# Patient Record
Sex: Female | Born: 1975
Health system: Southern US, Academic
[De-identification: ages and names within clinical notes are randomized; demographics above are authoritative.]

## PROBLEM LIST (undated history)

## (undated) DIAGNOSIS — H189 Unspecified disorder of cornea: Secondary | ICD-10-CM

## (undated) DIAGNOSIS — L732 Hidradenitis suppurativa: Secondary | ICD-10-CM

## (undated) DIAGNOSIS — L700 Acne vulgaris: Secondary | ICD-10-CM

## (undated) HISTORY — DX: Hidradenitis suppurativa: L73.2

## (undated) HISTORY — DX: Unspecified disorder of cornea: H18.9

## (undated) HISTORY — DX: Acne vulgaris: L70.0

---

## 2008-08-29 ENCOUNTER — Emergency Department (HOSPITAL_BASED_OUTPATIENT_CLINIC_OR_DEPARTMENT_OTHER): Admission: EM | Admit: 2008-08-29 | Discharge: 2008-08-29 | Payer: Self-pay | Admitting: Emergency Medicine

## 2008-08-31 ENCOUNTER — Emergency Department (HOSPITAL_BASED_OUTPATIENT_CLINIC_OR_DEPARTMENT_OTHER): Admission: EM | Admit: 2008-08-31 | Discharge: 2008-08-31 | Payer: Self-pay | Admitting: Emergency Medicine

## 2009-03-10 HISTORY — PX: CORNEAL TRANSPLANT: SHX108

## 2010-03-10 HISTORY — PX: TUBAL LIGATION: SHX77

## 2010-03-24 LAB — HM PAP SMEAR

## 2012-05-07 LAB — BASIC METABOLIC PANEL
BUN: 12 mg/dL (ref 4–21)
Creatinine: 0.6 mg/dL (ref 0.5–1.1)
Glucose: 90 mg/dL

## 2012-05-07 LAB — CBC AND DIFFERENTIAL
HCT: 40 % (ref 36–46)
Platelets: 235 10*3/uL (ref 150–399)
WBC: 5 10^3/mL

## 2012-05-07 LAB — HEPATIC FUNCTION PANEL
ALT: 12 U/L (ref 7–35)
Alkaline Phosphatase: 64 U/L (ref 25–125)
Bilirubin, Total: 0.5 mg/dL

## 2012-05-07 LAB — TSH: TSH: 1.72 u[IU]/mL (ref 0.41–5.90)

## 2012-05-07 LAB — LIPID PANEL
LDL Cholesterol: 140 mg/dL
Triglycerides: 77 mg/dL (ref 40–160)

## 2012-05-26 ENCOUNTER — Ambulatory Visit (INDEPENDENT_AMBULATORY_CARE_PROVIDER_SITE_OTHER): Payer: 59 | Admitting: Family Medicine

## 2012-05-26 ENCOUNTER — Other Ambulatory Visit (HOSPITAL_COMMUNITY)
Admission: RE | Admit: 2012-05-26 | Discharge: 2012-05-26 | Disposition: A | Discharge status: Home / Self Care | Payer: 59 | Source: Ambulatory Visit | Attending: Family Medicine | Admitting: Family Medicine

## 2012-05-26 ENCOUNTER — Encounter: Payer: Self-pay | Admitting: Family Medicine

## 2012-05-26 VITALS — BP 102/72 | HR 109 | Ht 60.0 in | Wt 154.0 lb

## 2012-05-26 DIAGNOSIS — Z124 Encounter for screening for malignant neoplasm of cervix: Secondary | ICD-10-CM

## 2012-05-26 DIAGNOSIS — Z0000 Encounter for general adult medical examination without abnormal findings: Secondary | ICD-10-CM

## 2012-05-26 DIAGNOSIS — Z01419 Encounter for gynecological examination (general) (routine) without abnormal findings: Secondary | ICD-10-CM | POA: Insufficient documentation

## 2012-05-26 DIAGNOSIS — Z1151 Encounter for screening for human papillomavirus (HPV): Secondary | ICD-10-CM | POA: Insufficient documentation

## 2012-05-26 LAB — POCT URINALYSIS DIPSTICK
Bilirubin, UA: NEGATIVE
Blood, UA: NEGATIVE
Glucose, UA: NEGATIVE
Ketones, UA: NEGATIVE
Leukocytes, UA: NEGATIVE
Nitrite, UA: NEGATIVE
Protein, UA: NEGATIVE
Spec Grav, UA: 1.01
Urobilinogen, UA: NEGATIVE
pH, UA: 8

## 2012-05-26 MED ORDER — METRONIDAZOLE 0.75 % VA GEL: 1.0000 | Freq: Two times a day (BID) | VAGINAL | Status: AC

## 2012-05-26 NOTE — Progress Notes (Deleted)
Subjective:     Patient ID: Amanda Kennedy, female   DOB: 09/10/1975, 37 y.o.   MRN: [id number]  HPI Amanda Kennedy is here today for her annual exam.  She has done well since her last office visit. She would like to discuss weight loss and the Accutane treatment for acne.     Review of Systems     Objective:   Physical Exam     Assessment:     ***    Plan:     ***

## 2012-05-26 NOTE — Progress Notes (Signed)
Patient ID: Amanda Kennedy, female   DOB: 09/10/1975, 37 y.o.   MRN: [id number] Chief Complaint  Patient presents with  . Annual Exam    Pap   . Weight Loss  . Acne     HPI:  Amanda Kennedy is here for her annual exam with pap.  She has done well since her last visit.  We discussed her doing Accutane at her last visit.  She read over the booklet and completed her information.  Her only question/concern is the part she read about depression.  She is also interested in weight loss. She has a wedding in May that she is wanting to lose weight for.  She checked into Qsymia and says that her insurance does cover it.  She is also interested in learning more about the HCG diet.    Past Medical History  Diagnosis Date  . Cystic acne   . Corneal abnormality     right  . Hidradenitis    Past Surgical History  Procedure Laterality Date  . Tubal ligation  2012  . Corneal transplant Right 2011    [facility] - Dr Colon Branch   Family History  Problem Relation Age of Onset  . Vascular Disease Father     Enlarged Heart  . Diabetes type II Father     Social History History  Substance Use Topics  . Smoking status: Never Smoker   . Smokeless tobacco: Never Used  . Alcohol Use: No    Current Outpatient Prescriptions  Medication Sig Dispense Refill  . Adapalene-Benzoyl Peroxide (EPIDUO) 0.1-2.5 % gel Apply 1 application topically at bedtime. Apply a small amount to clean, dry skin at night.      Marland Kitchen aluminum chloride (DRYSOL) 20 % external solution Apply 1 application topically 3 (three) times a week.      . doxycycline (ADOXA) 50 MG tablet Take 50-100 mg by mouth daily.      . prednisoLONE acetate (PRED FORTE) 1 % ophthalmic suspension Place 2 drops into both eyes once.      . metroNIDAZOLE (METROGEL VAGINAL) 0.75 % vaginal gel Place 1 Applicatorful vaginally 2 (two) times daily.  70 g  0   No current facility-administered medications for this visit.   Allergies  Allergen Reactions  . Ibuprofen      Review of Systems  Constitutional: Negative.  Negative for fever, chills, diaphoresis, activity change, appetite change, fatigue and unexpected weight change.       She has had trouble losing weight even though she is only eating 1000 calories per day and is working out with a Psychologist, educational.    HENT: Negative for hearing loss, ear pain, nosebleeds, congestion, sore throat, facial swelling, rhinorrhea, sneezing, drooling, mouth sores, trouble swallowing, neck pain, neck stiffness, dental problem, voice change, postnasal drip, sinus pressure, tinnitus and ear discharge.   Eyes: Negative for photophobia, pain, discharge, redness, itching and visual disturbance.  Respiratory: Negative for apnea, cough, choking, chest tightness, shortness of breath, wheezing and stridor.   Cardiovascular: Negative for chest pain, palpitations and leg swelling.  Gastrointestinal: Negative for nausea, vomiting, abdominal pain, diarrhea, constipation, blood in stool, abdominal distention, anal bleeding and rectal pain.  Endocrine: Negative for cold intolerance, heat intolerance, polydipsia, polyphagia and polyuria.  Genitourinary: Negative for dysuria, urgency, frequency, hematuria, flank pain, decreased urine volume, vaginal bleeding, vaginal discharge, enuresis, difficulty urinating, genital sores, vaginal pain, menstrual problem, pelvic pain and dyspareunia.  Musculoskeletal: Negative for myalgias, back pain, joint swelling, arthralgias and gait problem.  Skin: Negative for color change, pallor and rash. Wound: Acne on face and cysts in axilla   Allergic/Immunologic: Negative for environmental allergies, food allergies and immunocompromised state.  Neurological: Negative for dizziness, tremors, seizures, syncope, facial asymmetry, speech difficulty, weakness, light-headedness, numbness and headaches.  Hematological: Negative for adenopathy. Does not bruise/bleed easily.  Psychiatric/Behavioral: Negative for suicidal ideas,  hallucinations, behavioral problems, confusion, sleep disturbance, self-injury, dysphoric mood, decreased concentration and agitation. The patient is not nervous/anxious and is not hyperactive.   All other systems reviewed and are negative.   BP 102/72  Pulse 109  Ht 5' (1.524 m)  Wt 154 lb (69.854 kg)  BMI 30.08 kg/m2  LMP 05/07/2012 Physical Exam  Constitutional: She is oriented to person, place, and time. She appears well-developed and well-nourished.  HENT:  Head: Normocephalic and atraumatic.  Right Ear: External ear normal.  Left Ear: External ear normal.  Nose: Nose normal.  Mouth/Throat: Oropharynx is clear and moist.  Eyes: Conjunctivae and EOM are normal. Pupils are equal, round, and reactive to light.  Neck: Normal range of motion. Thyromegaly (The left side of her thyroid is enlarged.  ) present.  Cardiovascular: Normal rate, regular rhythm, normal heart sounds and intact distal pulses.  Exam reveals no gallop and no friction rub.   No murmur heard. Pulmonary/Chest: Effort normal and breath sounds normal.  Abdominal: Soft. Bowel sounds are normal.  Genitourinary: Uterus normal. Vaginal discharge (She has a thin, white discharge ) found.  Musculoskeletal: Normal range of motion. She exhibits no edema and no tenderness.  Lymphadenopathy:    She has no cervical adenopathy.  Neurological: She is alert and oriented to person, place, and time. She has normal reflexes.  Skin: Skin is warm and dry.  Acne is present on her face.   Psychiatric: She has a normal mood and affect. Her behavior is normal. Judgment and thought content normal.    Assessment:   1)  Normal Exam - U/A was WNL.  We'll check her for HPV 16/18.    2)  Acne:  She may do Accutane in the future.  She is going to continue on Epiduo for now.    3)  Weight:  She is interested in losing 15-20 lbs so the HCG diet is probably the best choice for her   4)  Bacterial Vaginosis   Plan:  1)  We'll mail her a  copy of "Pounds & Inches".  She will read this and decide if she wants to do the HCG diet.   2)  She is to use Metrogel BID for 5 days.

## 2012-05-26 NOTE — Patient Instructions (Addendum)
Plan:   1)  Read Pounds and Inches to decide if you want to do the HCG diet.  2)  Use the Metrogel twice a day for 5 days.  3)  Continue with the Epiduo for the next month.  If you decide to do Accutane then you'll need to do it after the HCG diet.    Goiter Goiter is an enlarged thyroid gland. The thyroid gland sits at the base of the front of the neck. The gland produces hormones that regulate mood, body temperature, pulse rate, and digestion. Most goiters are painless and are not a cause for serious concern. Goiters and conditions that cause goiters can be treated if necessary.  CAUSES  Common causes of goiter include:  Graves disease (causes too much hormone to be produced [hyperthyroidism]).  Hashimoto's disease (causes too little hormone to be produced [hypothyroidism]).  Thyroiditis (inflammation of the thyroid sometimes caused by virus or pregnancy).  Nodular goiter (small bumps form; sometimes called toxic nodular goiter).  Pregnancy.  Thyroid cancer (very few goiters with nodules are cancerous).  Certain medications.  Radiation exposure.  Iodine deficiency (more common in developing countries in inland populations). RISK FACTORS Risk factors for goiter include:  A family history of goiter.  Female gender.  Inadequate iodine in the diet.  Age older than 40 years. SYMPTOMS  Many goiters do not cause symptoms. When symptoms do occur, they may include:  Swelling in the lower part of the neck. This swelling can range from a very small bump to a large lump.  A tight feeling in the throat.  A hoarse voice. Less commonly, a goiter may result in:  Coughing.  Wheezing.  Difficulty swallowing.  Difficulty breathing.  Bulging neck veins.  Dizziness. When a goiter is the result of hyperthyroidism, symptoms may include:  Rapid or irregular heart beat.  Sicknessin your stomach (nausea).  Vomiting.  Diarrhea.  Shaking.  Irritable feeling.  Bulging  eyes.  Weight loss.  Heat sensitivity.  Anxiety. When a goiter is the result of hypothyroidism, symptoms may include:  Tiredness.  Dry skin.  Constipation.  Weight gain.  Irregular menstrual cycle.  Depressed mood.  Sensitivity to cold. DIAGNOSIS  Tests used to diagnose goiter include:  A physical exam.  Blood tests, including thyroid hormone levels and antibody testing.  Ultrasonography, computerized X-ray scan (computed tomography, CT) or computerized magnetic scan (magnetic resonance imaging, MRI).  Thyroid scan (imaging along with safe radioactive injection).  Tissue sample taken (biopsy) of nodules. This is sometimes done to confirm that the nodules are not cancerous. TREATMENT  Treatment will depend on the cause of the goiter. Treatment may include:  Monitoring. In some cases, no treatment is necessary, and your doctor will monitor yourcondition at regular check ups.  Medications and supplements. Thyroid medication (thyroid hormone replacement) is available for hyperthroidism and hypothyroidism.  If inflammation is the cause, over-the-counter medication or steroid medication may be recommended.  Goiters caused by iodine deficiency can be treated with iodine supplements or changes in diet.  Radioactive iodine treatment. Radioactive iodine is injected into the blood. It travels to the thyroid gland, kills thyroid cells, and reduces the size of the gland. This is only used when the thyroid gland is overactive. Lifelong thyroid hormone medication is often necessary after this treatment.  Surgery. A procedure to remove all or part of the gland may be recommended in severe cases or when cancer is the cause. Hormones can be taken to replace the hormones normally produced by  the thyroid. HOME CARE INSTRUCTIONS   Take medications as directed.  Follow your caregiver's recommendations for any dietary changes.  Follow up with your caregiver for further examination and  testing, as directed. PREVENTION   If you have a family history of goiter, discuss screening with your doctor.  Make sure you are getting enough iodine in your diet.  Use of iodized table salt can help prevent iodine deficiency. Document Released: 08/14/2009 Document Revised: 05/19/2011 Document Reviewed: 08/14/2009 [facility] Patient Information 2013 [place], [place].

## 2012-06-04 ENCOUNTER — Ambulatory Visit (INDEPENDENT_AMBULATORY_CARE_PROVIDER_SITE_OTHER): Payer: 59 | Admitting: Family Medicine

## 2012-06-04 ENCOUNTER — Encounter: Payer: Self-pay | Admitting: Family Medicine

## 2012-06-04 VITALS — BP 115/82 | HR 96 | Wt 153.0 lb

## 2012-06-04 DIAGNOSIS — R000 Tachycardia, unspecified: Secondary | ICD-10-CM

## 2012-06-04 DIAGNOSIS — E669 Obesity, unspecified: Secondary | ICD-10-CM

## 2012-06-04 MED ORDER — CHORIONIC GONADOTROPIN 10000 UNITS IM SOLR: INTRAMUSCULAR | Status: DC

## 2012-06-04 NOTE — Progress Notes (Signed)
Subjective:     Patient ID: Amanda Kennedy, female   DOB: 1976/02/07, 37 y.o.   MRN: [id number]  HPI [name] is here today to begin the Step By Step Diet & Fitness Program.  She has tried numerous diet programs and has not been successful with them.  She feels that she needs to lose weight to improve her general health.  Review of Systems  Constitutional: Positive for unexpected weight change.       Objective:   Physical Exam  Constitutional: She appears well-nourished.  Neck: Neck supple. No thyromegaly present.  Cardiovascular: Normal rate, regular rhythm and normal heart sounds.   Pulmonary/Chest: Effort normal and breath sounds normal.  Abdominal: She exhibits no distension and no mass. There is no tenderness.  Psychiatric: She has a normal mood and affect. Her behavior is normal. Judgment and thought content normal.       Assessment:     Obesity    Plan:     HCG Diet

## 2012-06-06 ENCOUNTER — Encounter: Payer: Self-pay | Admitting: Family Medicine

## 2012-06-06 DIAGNOSIS — E669 Obesity, unspecified: Secondary | ICD-10-CM | POA: Insufficient documentation

## 2012-06-06 DIAGNOSIS — R000 Tachycardia, unspecified: Secondary | ICD-10-CM | POA: Insufficient documentation

## 2012-06-06 MED ORDER — CYANOCOBALAMIN 1000 MCG/ML IJ SOLN: INTRAMUSCULAR | Status: DC

## 2012-06-06 MED ORDER — CHORIONIC GONADOTROPIN 10000 UNITS IM SOLR: INTRAMUSCULAR | Status: DC

## 2012-06-06 NOTE — Patient Instructions (Signed)
HCG Diet - 500 Calories per day;  .15 cc IM daily    HCG Diet Human Chorionic Gonadotropin, HCG injection What is this medicine? HUMAN CHORIONIC GONADOTROPIN (HYOO muhn kor ee ON ik goe NAD oh troe pin) is a hormone. HCG is used for different reasons in men and women. HCG is used in combination with other fertility drugs to increase a woman's chance of pregnancy. In men or adolescent boys, HCG helps the production of testosterone and sperm. HCG is also used in female children with cryptorchidism, a specific birth problem of the testes. This medicine may be used for other purposes; ask your health care provider or pharmacist if you have questions. What should I tell my health care provider before I take this medicine? They need to know if you have any of these conditions: -asthma -cyst on the ovary -heart disease -migraine -kidney disease -ovarian cancer or other female-related cancer -prostate cancer or other female-related cancer -seizures (convulsions) -an unusual or allergic reaction to HCG, other hormones, other medicines, foods, dyes, or preservatives -pregnant (this medicine should not be used if you are already pregnant) -breast feeding How should I use this medicine? This medicine is either injected in a muscle, like the thigh or buttocks, or it may be given under the skin instead. Ask your doctor which way is right for you. You will be taught how to prepare and give this medicine. Use exactly as directed. Take your medicine at regular intervals. Do not take your medicine more often than directed. It is important that you put your used needles and syringes in a special sharps container. Do not put them in a trash can. If you do not have a sharps container, call your pharmacist or healthcare provider to get one. Talk to your pediatrician regarding the use of this medicine in children. While this drug may be prescribed for female children as young as several months of age for selected  conditions, precautions do apply. Overdosage: If you think you have taken too much of this medicine contact a poison control center or emergency room at once. NOTE: This medicine is only for you. Do not share this medicine with others. What if I miss a dose? It is important not to miss your dose. Call your doctor or health care professional if you are unable to keep an appointment. For men or boys: If you are giving your own injections, and miss a dose, take it as soon as you remember. If you forget until the next day, skip the missed dose and continue with your schedule. Do not use double or extra doses. Call your doctor if you have any questions. For women receiving fertility treatment: It is important not to miss a dose, as the success of your fertility treatment depends on proper use of this medication. Call your doctor or health care professional if you are unable to keep an appointment. If you are giving your own injections, do not use double or extra doses. Call your doctor if you have any questions. What may interact with this medicine? Check with your doctor or healthcare professional if you are taking any of the following medications: -herbal or dietary supplements, like blue cohosh, black cohosh, or chasteberry This list may not describe all possible interactions. Give your health care provider a list of all the medicines, herbs, non-prescription drugs, or dietary supplements you use. Also tell them if you smoke, drink alcohol, or use illegal drugs. Some items may interact with your medicine. What should  I watch for while using this medicine? For men or boys: Your doctor must closely monitor you. Call your doctor if you notice any unusual effects. For women receiving fertility treatments: Your doctor must closely monitor you. Urine samples, blood tess, or ultrasound exams may be used to monitor treatment. If you think you have become pregnant, contact your doctor at once. Talk with your  doctor about limiting alcohol and decreasing tobacco use during your fertility treatments. What side effects may I notice from receiving this medicine? Side effects that you should report to your doctor or health care professional as soon as possible: -allergic reactions like skin rash, itching or hives, swelling of the face, lips, or tongue For boys: -acne (pimples) -breast enlargement -enlargement of penis and testes -development of facial or pubic hair -a sudden increase in height For women on fertility treatments: -indigestion -nausea, vomiting -passing small amounts of urine -shortness of breath -stomach area or pelvic pain or bloating -swelling -rapid weight gain Side effects that usually do not require medical attention (report to your doctor or health care professional if they continue or are bothersome): -changes in emotions or mood -headache -pain, irritation or inflammation at the injection site -tiredness This list may not describe all possible side effects. Call your doctor for medical advice about side effects. You may report side effects to FDA at 1-800-FDA-1088. Where should I keep my medicine? Keep out of the reach of children. You may not need to store this medicine at home. If you are taking this medicine at home, ask your pharmacist how to store the product you are using. Throw away any unused medication after the expiration date. NOTE: This sheet is a summary. It may not cover all possible information. If you have questions about this medicine, talk to your doctor, pharmacist, or health care provider.  2012, [employer]. (07/09/2007 4:13:33 PM)

## 2012-06-11 ENCOUNTER — Ambulatory Visit: Payer: 59 | Admitting: *Deleted

## 2012-06-11 VITALS — Wt 151.0 lb

## 2012-06-11 DIAGNOSIS — E669 Obesity, unspecified: Secondary | ICD-10-CM

## 2012-06-17 ENCOUNTER — Ambulatory Visit (INDEPENDENT_AMBULATORY_CARE_PROVIDER_SITE_OTHER): Payer: 59 | Admitting: Family Medicine

## 2012-06-17 VITALS — BP 118/85 | HR 115 | Ht 60.0 in | Wt 152.0 lb

## 2012-06-17 DIAGNOSIS — E669 Obesity, unspecified: Secondary | ICD-10-CM

## 2012-06-17 NOTE — Progress Notes (Signed)
Subjective:     Patient ID: Amanda Kennedy, female   DOB: May 05, 1975, 37 y.o.   MRN: [id number]  HPI Amanda Kennedy is here today for a follow up of her weight loss. She has just completed her 2nd week of the "Step by Step" Program. She is injecting .15 cc of HCG daily and has been trying to follow the 500 calorie diet but has not followed it as closely as she could/should.  She has gained one pound since her last visit. She feels that she has lost inches. She is taking the Phendimetrazine without difficulty.       Review of Systems  Constitutional: Positive for appetite change. Negative for fatigue.  Cardiovascular: Negative for chest pain, palpitations and leg swelling.  Gastrointestinal: Negative for constipation.  Neurological: Negative for weakness and light-headedness.  Psychiatric/Behavioral: Negative for sleep disturbance.       Objective:   Physical Exam  Constitutional: She is oriented to person, place, and time. She appears well-nourished. No distress.  HENT:  Head: Normocephalic.  Eyes: Conjunctivae are normal. No scleral icterus.  Neck: Normal range of motion. No thyromegaly present.  Cardiovascular: Normal rate, regular rhythm and normal heart sounds.   Pulmonary/Chest: Effort normal and breath sounds normal.  Musculoskeletal: Normal range of motion.  Neurological: She is alert and oriented to person, place, and time.  Skin: Skin is warm and dry. No rash noted.  Psychiatric: She has a normal mood and affect. Her behavior is normal. Judgment and thought content normal.       Assessment:     Obesity    Plan:     We discussed the fact that she has not been following the diet according to Dr. Melene Muller plan.  She was given the sheet again and was encouraged to follow it exactly if she wants to lose weight.    TIME 15 MINUTES:  MORE THAN 50 % OF TIME WAS INVOLVED IN COUNSELING.

## 2012-06-22 ENCOUNTER — Encounter: Payer: Self-pay | Admitting: Family Medicine

## 2012-06-23 ENCOUNTER — Encounter: Payer: Self-pay | Admitting: *Deleted

## 2012-06-24 ENCOUNTER — Ambulatory Visit: Payer: 59 | Admitting: *Deleted

## 2012-06-24 ENCOUNTER — Encounter: Payer: Self-pay | Admitting: *Deleted

## 2012-06-28 ENCOUNTER — Encounter: Payer: Self-pay | Admitting: *Deleted

## 2012-06-28 NOTE — Progress Notes (Signed)
Unable to close encounter

## 2012-07-02 ENCOUNTER — Encounter: Payer: Self-pay | Admitting: Family Medicine

## 2012-07-02 ENCOUNTER — Ambulatory Visit (INDEPENDENT_AMBULATORY_CARE_PROVIDER_SITE_OTHER): Payer: 59 | Admitting: Family Medicine

## 2012-07-02 VITALS — BP 115/79 | HR 108 | Wt 150.0 lb

## 2012-07-02 DIAGNOSIS — L708 Other acne: Secondary | ICD-10-CM

## 2012-07-02 DIAGNOSIS — B372 Candidiasis of skin and nail: Secondary | ICD-10-CM

## 2012-07-02 DIAGNOSIS — E669 Obesity, unspecified: Secondary | ICD-10-CM

## 2012-07-02 DIAGNOSIS — L709 Acne, unspecified: Secondary | ICD-10-CM

## 2012-07-02 DIAGNOSIS — J309 Allergic rhinitis, unspecified: Secondary | ICD-10-CM

## 2012-07-02 MED ORDER — MOMETASONE FUROATE 50 MCG/ACT NA SUSP: 2.0000 | Freq: Every day | NASAL | Status: AC

## 2012-07-02 MED ORDER — NYSTATIN 100000 UNIT/GM EX CREA: TOPICAL_CREAM | Freq: Two times a day (BID) | CUTANEOUS | Status: AC

## 2012-07-02 MED ORDER — PHENTERMINE HCL 37.5 MG PO TABS
37.5000 mg | ORAL_TABLET | Freq: Every day | ORAL | Status: AC
Start: 2012-07-02 — End: 2013-07-02

## 2012-07-02 NOTE — Progress Notes (Signed)
Subjective:     Patient ID: Amanda Kennedy, female   DOB: Feb 14, 1976, 37 y.o.   MRN: [id number]  HPI  Amanda Kennedy is here today for a follow up of her weight loss and to discuss her allergies and a skin rash on her chest between her breasts.  She has taken Claritin and Zyrtec which are not giving her any relief from her allergy symptoms.  She has just completed her 4th week of the "Step By Step"  Program.  She has been Phendimetrazine doing HCG injections and has tried to follow the 500 calorie diet.  She feels that she has lost more inches than lbs (3) over the past month.  Another concern she has is a rash located between her breasts.  She is still interested in taking Accutane for her acne and hopes that it will also help the bumps under her arms.    Review of Systems  Constitutional: Negative for fatigue and unexpected weight change.  HENT: Positive for congestion, rhinorrhea and postnasal drip.   Eyes: Negative for itching.  Respiratory: Negative for cough and shortness of breath.   Cardiovascular: Negative for chest pain, palpitations and leg swelling.  Skin: Positive for rash (Yeast vs Fungal Infection between her breasts).       Moderate acne on her forehead.    Allergic/Immunologic: Positive for environmental allergies.  Neurological: Negative.   Psychiatric/Behavioral: Negative.        Objective:   Physical Exam  Constitutional: She appears well-nourished. No distress.  HENT:  Head: Normocephalic.  Mouth/Throat: No oropharyngeal exudate.  Eyes: Conjunctivae are normal. Right eye exhibits no discharge. Left eye exhibits no discharge.  Neck: Neck supple.  Cardiovascular: Normal rate, regular rhythm and normal heart sounds.  Exam reveals no gallop and no friction rub.   No murmur heard. Pulmonary/Chest: Effort normal and breath sounds normal. She has no wheezes. She exhibits no tenderness.  Lymphadenopathy:    She has no cervical adenopathy.  Neurological: She is alert.  Skin: Skin  is warm and dry. Rash noted.  Acne on forehead.   Psychiatric: She has a normal mood and affect.       Assessment:     Allergic Rhinitis Acne Obesity     Plan:     1)  Neil Med Sinus Rinse (Distilled Water/Blue Packet) Rinse out nasal passage 1-2 times per day.  Try Allegra or Allegra D; Chlorpheniramine 4 mg 2-3 times per day plus pseudoephedrine 120 mg 2 times per day; Nasonex 2 sprays at night.    2)  She will return to start on Accutane in one month.    3)  She is going to move on to Phase III of the Step By Step program.

## 2012-07-02 NOTE — Patient Instructions (Addendum)
1)  Allergic Rhinitis - [facility] Med Sinus Rinse (Distilled Water/Blue Packet) Rinse out nasal passage 1-2 times per day.  Try Allegra or Allegra D; Chlorpheniramine 4 mg 2-3 times per day plus pseudoephedrine 120 mg 2 times per day; Nasonex 2 sprays at night.    Allergic Rhinitis Allergic rhinitis is when the mucous membranes in the nose respond to allergens. Allergens are particles in the air that cause your body to have an allergic reaction. This causes you to release allergic antibodies. Through a chain of events, these eventually cause you to release histamine into the blood stream (hence the use of antihistamines). Although meant to be protective to the body, it is this release that causes your discomfort, such as frequent sneezing, congestion and an itchy runny nose.  CAUSES  The pollen allergens may come from grasses, trees, and weeds. This is seasonal allergic rhinitis, or "hay fever." Other allergens cause year-round allergic rhinitis (perennial allergic rhinitis) such as house dust mite allergen, pet dander and mold spores.  SYMPTOMS   Nasal stuffiness (congestion).  Runny, itchy nose with sneezing and tearing of the eyes.  There is often an itching of the mouth, eyes and ears. It cannot be cured, but it can be controlled with medications. DIAGNOSIS  If you are unable to determine the offending allergen, skin or blood testing may find it. TREATMENT   Avoid the allergen.  Medications and allergy shots (immunotherapy) can help.  Hay fever may often be treated with antihistamines in pill or nasal spray forms. Antihistamines block the effects of histamine. There are over-the-counter medicines that may help with nasal congestion and swelling around the eyes. Check with your caregiver before taking or giving this medicine. If the treatment above does not work, there are many new medications your caregiver can prescribe. Stronger medications may be used if initial measures are ineffective.  Desensitizing injections can be used if medications and avoidance fails. Desensitization is when a patient is given ongoing shots until the body becomes less sensitive to the allergen. Make sure you follow up with your caregiver if problems continue. SEEK MEDICAL CARE IF:   You develop fever (more than 100.5 F (38.1 C).  You develop a cough that does not stop easily (persistent).  You have shortness of breath.  You start wheezing.  Symptoms interfere with normal daily activities. Document Released: 11/19/2000 Document Revised: 05/19/2011 Document Reviewed: 05/31/2008 [facility] Patient Information 2013 [place], [place].

## 2012-07-04 DIAGNOSIS — L709 Acne, unspecified: Secondary | ICD-10-CM | POA: Insufficient documentation

## 2012-07-04 DIAGNOSIS — J309 Allergic rhinitis, unspecified: Secondary | ICD-10-CM | POA: Insufficient documentation

## 2012-07-04 DIAGNOSIS — B372 Candidiasis of skin and nail: Secondary | ICD-10-CM | POA: Insufficient documentation

## 2012-07-07 NOTE — Progress Notes (Unsigned)
Amanda Kennedy came for her weight check.  She was reminded to come for her next appt. PG

## 2012-07-07 NOTE — Progress Notes (Signed)
Luis came to check her weight.  She was reminded to come next week for her next weight check. PG

## 2012-07-09 ENCOUNTER — Encounter: Payer: Self-pay | Admitting: Family Medicine

## 2012-07-09 ENCOUNTER — Ambulatory Visit (INDEPENDENT_AMBULATORY_CARE_PROVIDER_SITE_OTHER): Payer: 59 | Admitting: Family Medicine

## 2012-07-09 ENCOUNTER — Ambulatory Visit (HOSPITAL_BASED_OUTPATIENT_CLINIC_OR_DEPARTMENT_OTHER)
Admission: RE | Admit: 2012-07-09 | Discharge: 2012-07-09 | Disposition: A | Discharge status: Home / Self Care | Payer: 59 | Source: Ambulatory Visit | Attending: Family Medicine | Admitting: Family Medicine

## 2012-07-09 VITALS — BP 107/74 | HR 125 | Temp 100.0°F | Wt 149.0 lb

## 2012-07-09 DIAGNOSIS — R509 Fever, unspecified: Secondary | ICD-10-CM

## 2012-07-09 DIAGNOSIS — R059 Cough, unspecified: Secondary | ICD-10-CM

## 2012-07-09 DIAGNOSIS — R05 Cough: Secondary | ICD-10-CM

## 2012-07-09 LAB — POCT INFLUENZA A/B
Influenza A, POC: NEGATIVE
Influenza B, POC: NEGATIVE

## 2012-07-09 MED ORDER — HYDROCOD POLST-CHLORPHEN POLST 10-8 MG/5ML PO LQCR: 5.0000 mL | Freq: Two times a day (BID) | ORAL | Status: AC | PRN

## 2012-07-09 MED ORDER — AZITHROMYCIN 250 MG PO TABS: ORAL_TABLET | ORAL | Status: AC

## 2012-07-09 MED ORDER — BENZONATATE 200 MG PO CAPS: 200.0000 mg | ORAL_CAPSULE | Freq: Three times a day (TID) | ORAL | Status: AC | PRN

## 2012-07-09 NOTE — Progress Notes (Signed)
  Subjective:    Patient ID: Amanda Kennedy, female    DOB: Dec 17, 1975, 37 y.o.   MRN: [id number]  HPI:   Amanda Kennedy is here today with URI symptoms.   URI  This is a new problem. The current episode started in the past 7 days. The problem has been gradually worsening. The maximum temperature recorded prior to her arrival was 100 - 100.9 F. The fever has been present for 3 to 4 days. Associated symptoms include congestion, coughing, headaches and joint pain. Pertinent negatives include no chest pain or ear pain.      Review of Systems  Constitutional: Positive for fever and chills.  HENT: Positive for congestion. Negative for ear pain and neck stiffness.   Eyes: Negative for visual disturbance.  Respiratory: Positive for cough, chest tightness and shortness of breath.   Cardiovascular: Negative for chest pain and palpitations.  Gastrointestinal: Negative.   Genitourinary: Negative.   Musculoskeletal: Positive for joint pain.  Neurological: Positive for headaches.  Psychiatric/Behavioral: Negative.    Past Medical History  Diagnosis Date  . Cystic acne   . Corneal abnormality     right  . Hidradenitis    Family History  Problem Relation Age of Onset  . Vascular Disease Father     Enlarged Heart  . Diabetes type II Father        Objective:   Physical Exam  Constitutional: She appears well-nourished. She appears distressed (She is lying down on exam table.  ).  HENT:  Head: Normocephalic and atraumatic.  Mouth/Throat: No oropharyngeal exudate.  Eyes: Conjunctivae are normal. Right eye exhibits no discharge. Left eye exhibits no discharge. No scleral icterus.  Neck: Neck supple.  Cardiovascular: Normal rate, regular rhythm and normal heart sounds.  Exam reveals no gallop and no friction rub.   No murmur heard. Pulmonary/Chest: Effort normal and breath sounds normal. She has no wheezes. She exhibits no tenderness.  Abdominal: Soft. There is no tenderness.  Lymphadenopathy:   She has no cervical adenopathy.  Neurological: She is alert.  Skin: Skin is warm and dry. No rash noted.  Psychiatric: She has a normal mood and affect.       Assessment & Plan:   1)  Cough - CXR and Flu Test were WNL.    Mucinex DM - 1200/60 twice a day (lot of water)  Tessalon Perles 3 x per day Tussionex 1 tsp 2 x per day Dulera - 2 puffs twice a day Albuterol - 2 puffs 4 times per day Umcka Cold Care - 2 droppers 3-4 times per day  2)  Fever - Tylenol and Ibuprofen to control fever.

## 2012-07-09 NOTE — Patient Instructions (Addendum)
1)  Flu Test was negative and you don't have pneumonia.  2)  Chest Congestion/Wheezing:  Mucinex DM - 1200/60 twice a day (lot of water)  Tessalon Perles 3 x per day Tussionex 1 tsp 2 x per day Tylenol 1000 mg 3 x per day Dulera - 2 puffs twice a day Albuterol - 2 puffs 4 times per day  Umcka Cold Care - 2 droppers 3-4 times per day   If you are 10 days into this and not any better or worsen then try the Zithromax.  Acute Bronchitis You have acute bronchitis. This means you have a chest cold. The airways in your lungs are red and sore (inflamed). Acute means it is sudden onset.  CAUSES Bronchitis is most often caused by the same virus that causes a cold. SYMPTOMS   Body aches.  Chest congestion.  Chills.  Cough.  Fever.  Shortness of breath.  Sore throat. TREATMENT  Acute bronchitis is usually treated with rest, fluids, and medicines for relief of fever or cough. Most symptoms should go away after a few days or a week. Increased fluids may help thin your secretions and will prevent dehydration. Your caregiver may give you an inhaler to improve your symptoms. The inhaler reduces shortness of breath and helps control cough. You can take over-the-counter pain relievers or cough medicine to decrease coughing, pain, or fever. A cool-air vaporizer may help thin bronchial secretions and make it easier to clear your chest. Antibiotics are usually not needed but can be prescribed if you smoke, are seriously ill, have chronic lung problems, are elderly, or you are at higher risk for developing complications.Allergies and asthma can make bronchitis worse. Repeated episodes of bronchitis may cause longstanding lung problems. Avoid smoking and secondhand smoke.Exposure to cigarette smoke or irritating chemicals will make bronchitis worse. If you are a cigarette smoker, consider using nicotine gum or skin patches to help control withdrawal symptoms. Quitting smoking will help your lungs heal  faster. Recovery from bronchitis is often slow, but you should start feeling better after 2 to 3 days. Cough from bronchitis frequently lasts for 3 to 4 weeks. To prevent another bout of acute bronchitis:  Quit smoking.  Wash your hands frequently to get rid of viruses or use a hand sanitizer.  Avoid other people with cold or virus symptoms.  Try not to touch your hands to your mouth, nose, or eyes. SEEK IMMEDIATE MEDICAL CARE IF:  You develop increased fever, chills, or chest pain.  You have severe shortness of breath or bloody sputum.  You develop dehydration, fainting, repeated vomiting, or a severe headache.  You have no improvement after 1 week of treatment or you get worse. MAKE SURE YOU:   Understand these instructions.  Will watch your condition.  Will get help right away if you are not doing well or get worse. Document Released: 04/03/2004 Document Revised: 05/19/2011 Document Reviewed: 06/19/2010 [facility] Patient Information 2013 [place], [place].

## 2012-07-18 ENCOUNTER — Encounter: Payer: Self-pay | Admitting: Family Medicine

## 2012-08-03 ENCOUNTER — Encounter: Payer: Self-pay | Admitting: *Deleted

## 2012-08-03 ENCOUNTER — Ambulatory Visit: Payer: 59 | Admitting: Family Medicine

## 2012-08-17 ENCOUNTER — Ambulatory Visit: Payer: 59 | Admitting: Family Medicine

## 2012-08-17 DIAGNOSIS — Z0289 Encounter for other administrative examinations: Secondary | ICD-10-CM

## 2013-12-31 IMAGING — CR DG CHEST 2V
2 series · 2 of 2 positions shown · non-contrast
Comparison: None.

CLINICAL DATA: Cough, fever

CHEST - 2 VIEW

[w chest pa]
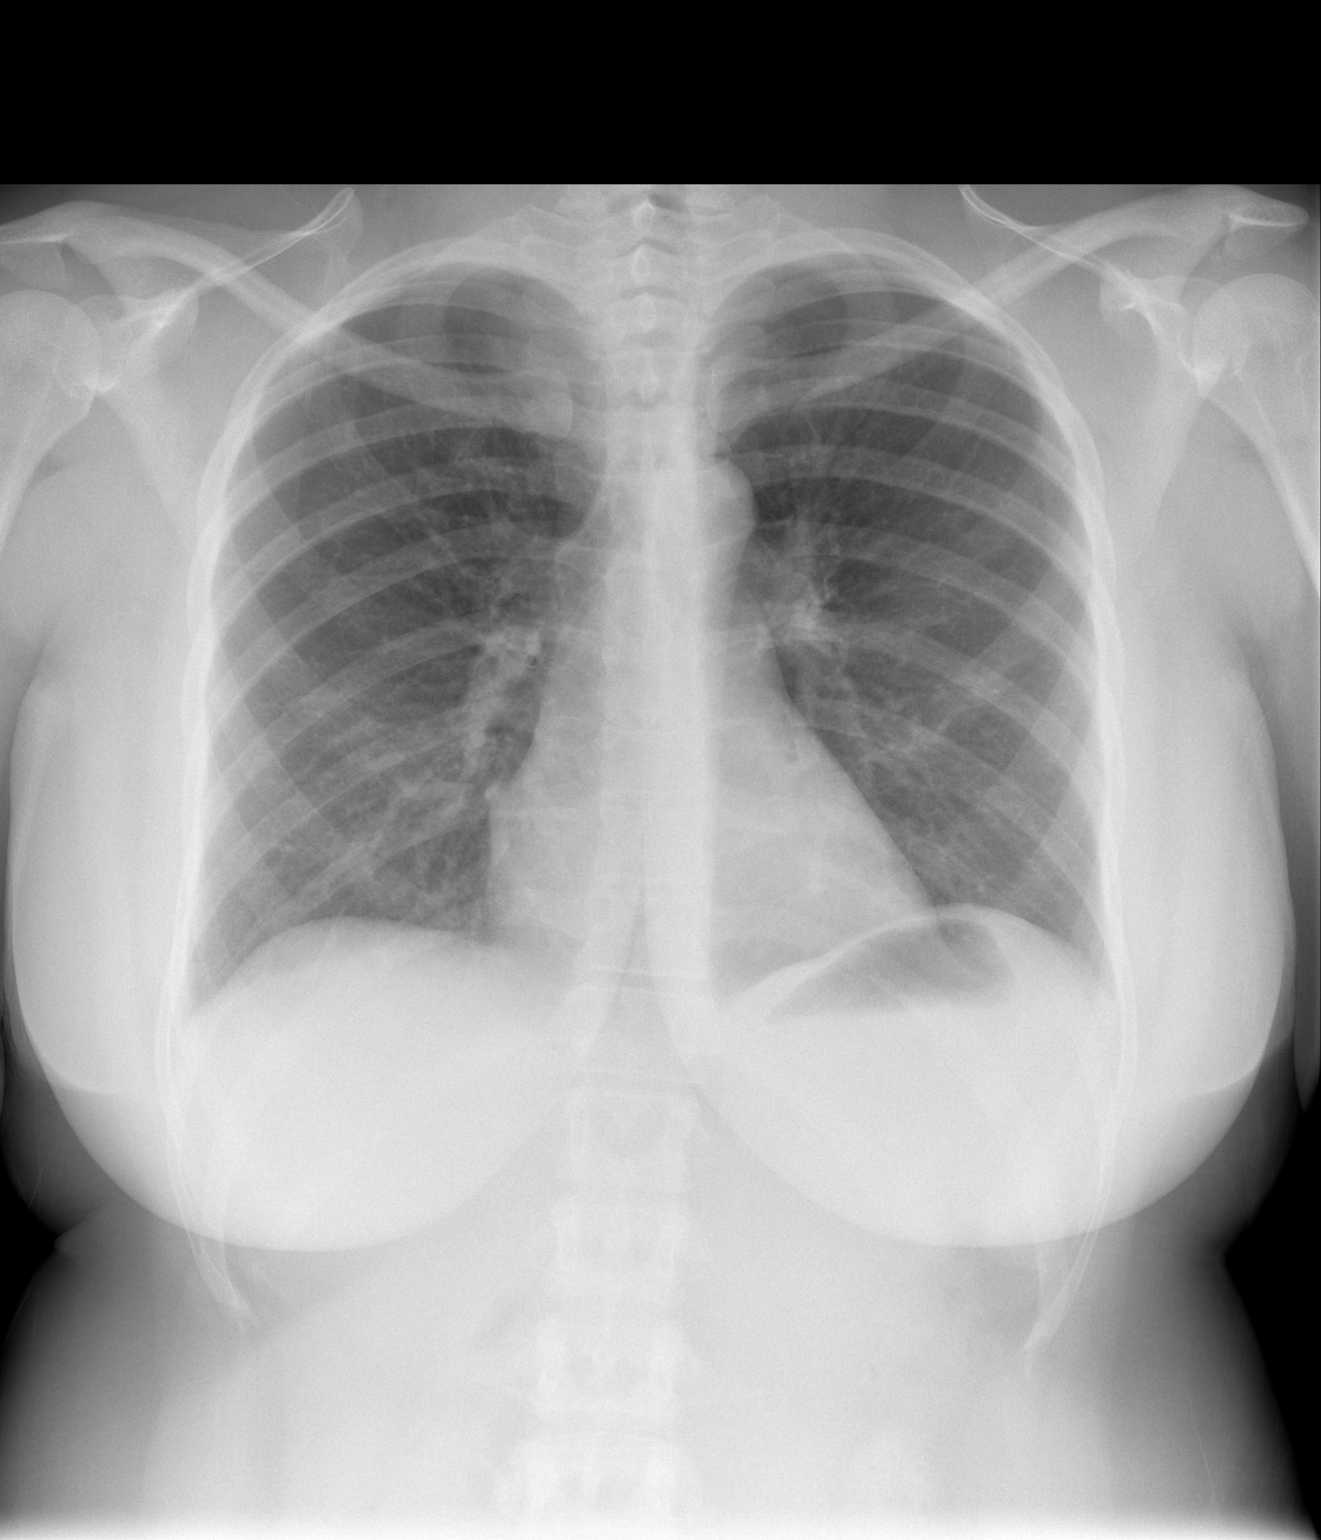

[w chest lat]
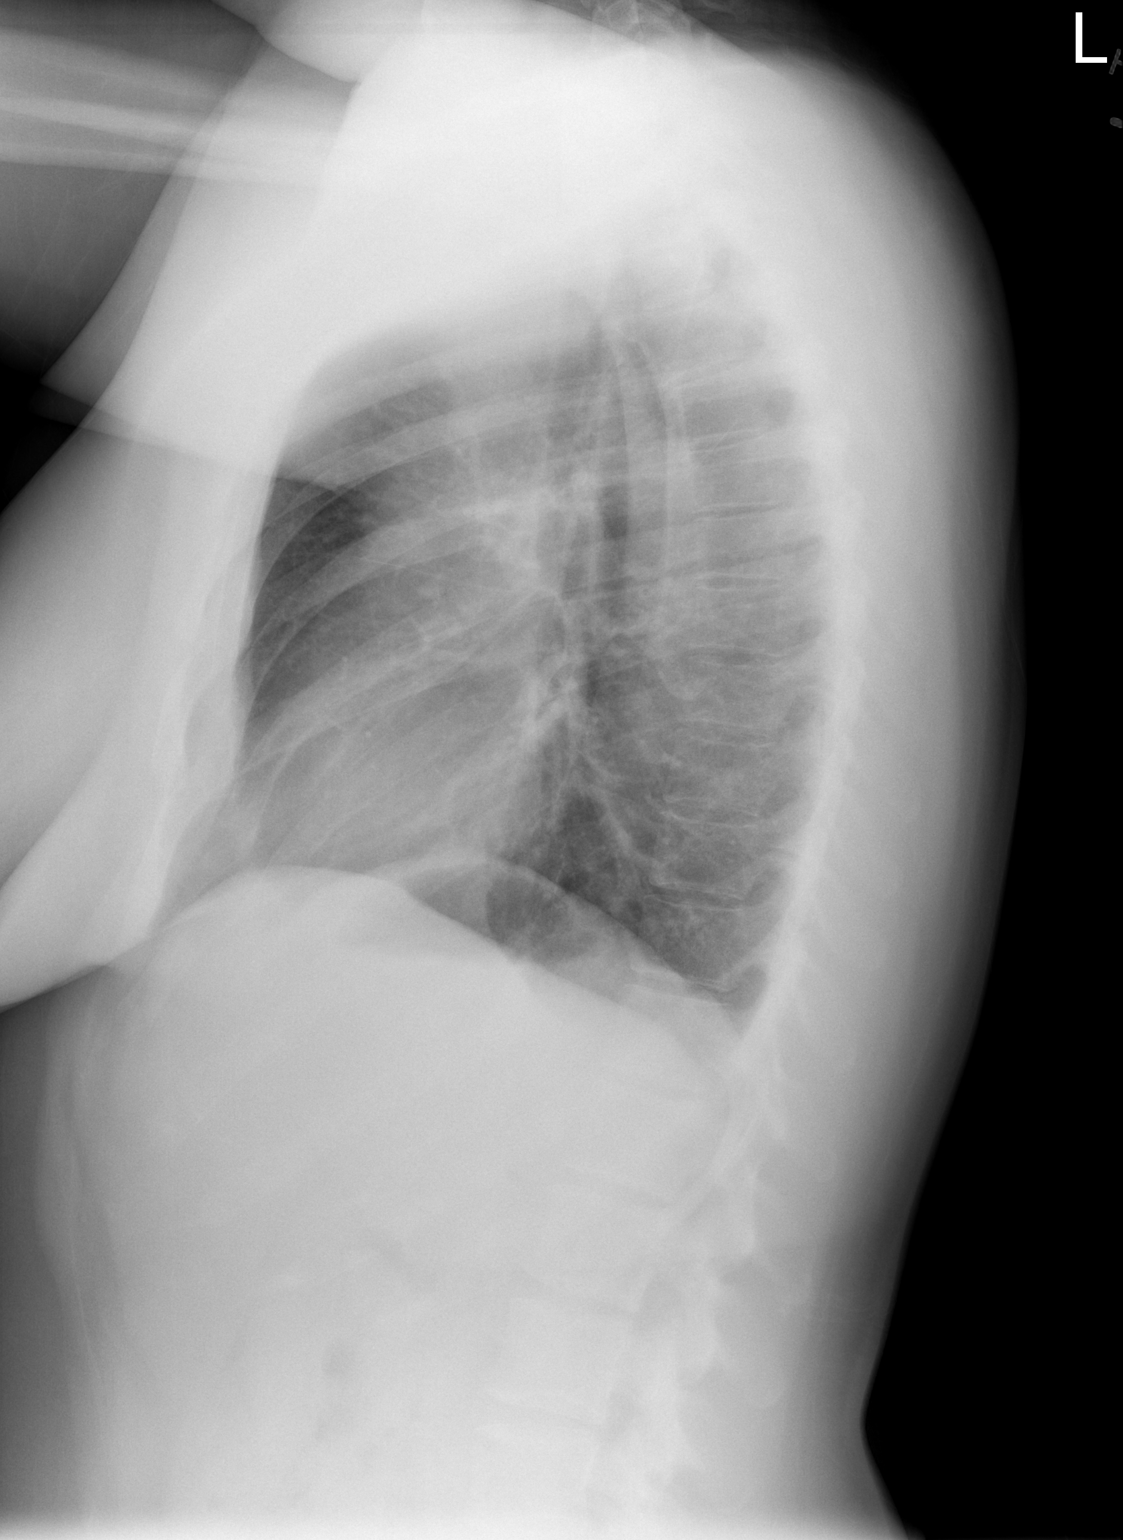

[2 of 2 positions shown; findings below may reference images not displayed]

FINDINGS: Cardiomediastinal silhouette is unremarkable.  No acute
infiltrate or pleural effusion.  No pulmonary edema.  Bony thorax
is unremarkable.
IMPRESSION: No active disease.

## 2017-04-29 MED FILL — HUMIRA PEN-CD/UC/HS STARTER/*CITRATE FREE/PNKT: HUMIRA PEN-CD/UC/HS STARTER/*CITRATE FREE/PNKT | 28 days supply | Qty: 3 | Fill #0

## 2017-05-27 MED FILL — HUMIRA PEN *NO CITRATE*/40MG/0.4ML/PNKT: HUMIRA PEN *NO CITRATE*/40MG/0.4ML/PNKT | 28 days supply | Qty: 4 | Fill #0

## 2017-05-27 MED FILL — SHARPS KIT/NA/MISC: SHARPS KIT/NA/MISC | 120 days supply | Qty: 1 | Fill #0

## 2017-06-22 MED FILL — HUMIRA PEN *NO CITRATE*/40MG/0.4ML/PNKT: HUMIRA PEN *NO CITRATE*/40MG/0.4ML/PNKT | 28 days supply | Qty: 4 | Fill #1

## 2017-07-13 MED FILL — HUMIRA PEN *NO CITRATE*/40MG/0.4ML/PNKT: HUMIRA PEN *NO CITRATE*/40MG/0.4ML/PNKT | 28 days supply | Qty: 4 | Fill #2

## 2017-08-05 MED FILL — HUMIRA PEN *NO CITRATE*/40MG/0.4ML/PNKT: HUMIRA PEN *NO CITRATE*/40MG/0.4ML/PNKT | 28 days supply | Qty: 4 | Fill #3

## 2017-08-31 MED FILL — HUMIRA PEN *NO CITRATE*/40MG/0.4ML/PNKT: HUMIRA PEN *NO CITRATE*/40MG/0.4ML/PNKT | 28 days supply | Qty: 4 | Fill #4

## 2017-09-28 MED FILL — HUMIRA PEN *NO CITRATE*/40MG/0.4ML/PNKT: HUMIRA PEN *NO CITRATE*/40MG/0.4ML/PNKT | 28 days supply | Qty: 4 | Fill #5

## 2019-01-18 ENCOUNTER — Telehealth: Payer: Self-pay | Admitting: Neurology

## 2019-01-18 NOTE — Telephone Encounter (Signed)
[name] from [facility] called said this patient was referred by another provider and could we see her sooner. I don;t see a referral, do you? If not, I can call Dr. Dorethea Clan and ask her to fax one.

## 2019-01-19 NOTE — Telephone Encounter (Signed)
I checked. We haven't received anything on pt

## 2019-11-22 ENCOUNTER — Other Ambulatory Visit: Payer: Self-pay | Admitting: Obstetrics and Gynecology

## 2019-11-22 DIAGNOSIS — N644 Mastodynia: Secondary | ICD-10-CM

## 2019-11-28 ENCOUNTER — Other Ambulatory Visit: Payer: Self-pay

## 2019-11-28 ENCOUNTER — Ambulatory Visit
Admission: RE | Admit: 2019-11-28 | Discharge: 2019-11-28 | Disposition: A | Discharge status: Home / Self Care | Payer: BC Managed Care – PPO | Source: Ambulatory Visit | Attending: Obstetrics and Gynecology | Admitting: Obstetrics and Gynecology

## 2019-11-28 ENCOUNTER — Ambulatory Visit: Payer: Self-pay

## 2019-11-28 DIAGNOSIS — N644 Mastodynia: Secondary | ICD-10-CM
